# Patient Record
Sex: Female | Born: 1965 | Race: White | Hispanic: No | Smoking: Never smoker
Health system: Southern US, Community
[De-identification: ages and names within clinical notes are randomized; demographics above are authoritative.]

---

## 2016-03-08 ENCOUNTER — Emergency Department (HOSPITAL_COMMUNITY)
Admission: EM | Admit: 2016-03-08 | Discharge: 2016-03-08 | Disposition: A | Discharge status: Home / Self Care | Payer: Managed Care, Other (non HMO) | Attending: Emergency Medicine | Admitting: Emergency Medicine

## 2016-03-08 ENCOUNTER — Encounter (HOSPITAL_COMMUNITY): Payer: Self-pay | Admitting: *Deleted

## 2016-03-08 DIAGNOSIS — Y929 Unspecified place or not applicable: Secondary | ICD-10-CM | POA: Insufficient documentation

## 2016-03-08 DIAGNOSIS — Y9389 Activity, other specified: Secondary | ICD-10-CM | POA: Insufficient documentation

## 2016-03-08 DIAGNOSIS — S81811A Laceration without foreign body, right lower leg, initial encounter: Secondary | ICD-10-CM | POA: Insufficient documentation

## 2016-03-08 DIAGNOSIS — Y999 Unspecified external cause status: Secondary | ICD-10-CM | POA: Diagnosis not present

## 2016-03-08 DIAGNOSIS — W268XXA Contact with other sharp object(s), not elsewhere classified, initial encounter: Secondary | ICD-10-CM | POA: Diagnosis not present

## 2016-03-08 MED ORDER — LIDOCAINE-EPINEPHRINE 1 %-1:100000 IJ SOLN
INTRAMUSCULAR | Status: DC
Start: 2016-03-08 — End: 2016-03-08
  Filled 2016-03-08: qty 1

## 2016-03-08 MED ORDER — TETANUS-DIPHTH-ACELL PERTUSSIS 5-2.5-18.5 LF-MCG/0.5 IM SUSP
0.5000 mL | Freq: Once | INTRAMUSCULAR | Status: AC
  Administered 2016-03-08: 0.5 mL via INTRAMUSCULAR
  Filled 2016-03-08: qty 0.5

## 2016-03-08 MED ORDER — TRAMADOL HCL 50 MG PO TABS: 50.0000 mg | ORAL_TABLET | Freq: Four times a day (QID) | ORAL | Status: AC | PRN

## 2016-03-08 MED ORDER — LIDOCAINE-EPINEPHRINE 2 %-1:100000 IJ SOLN: 20.0000 mL | Freq: Once | INTRAMUSCULAR | Status: DC

## 2016-03-08 NOTE — Discharge Instructions (Signed)
Please follow up with your doctor or return to the ER in 7 days for sutures removal.  Follow instruction below.    Laceration Care, Adult A laceration is a cut that goes through all layers of the skin. The cut also goes into the tissue that is right under the skin. Some cuts heal on their own. Others need to be closed with stitches (sutures), staples, skin adhesive strips, or wound glue. Taking care of your cut lowers your risk of infection and helps your cut to heal better. HOW TO TAKE CARE OF YOUR CUT For stitches or staples:  Keep the wound clean and dry.  If you were given a bandage (dressing), you should change it at least one time per day or as told by your doctor. You should also change it if it gets wet or dirty.  Keep the wound completely dry for the first 24 hours or as told by your doctor. After that time, you may take a shower or a bath. However, make sure that the wound is not soaked in water until after the stitches or staples have been removed.  Clean the wound one time each day or as told by your doctor:  Wash the wound with soap and water.  Rinse the wound with water until all of the soap comes off.  Pat the wound dry with a clean towel. Do not rub the wound.  After you clean the wound, put a thin layer of antibiotic ointment on it as told by your doctor. This ointment:  Helps to prevent infection.  Keeps the bandage from sticking to the wound.  Have your stitches or staples removed as told by your doctor. If your doctor used skin adhesive strips:   Keep the wound clean and dry.  If you were given a bandage, you should change it at least one time per day or as told by your doctor. You should also change it if it gets dirty or wet.  Do not get the skin adhesive strips wet. You can take a shower or a bath, but be careful to keep the wound dry.  If the wound gets wet, pat it dry with a clean towel. Do not rub the wound.  Skin adhesive strips fall off on their own.  You can trim the strips as the wound heals. Do not remove any strips that are still stuck to the wound. They will fall off after a while. If your doctor used wound glue:  Try to keep your wound dry, but you may briefly wet it in the shower or bath. Do not soak the wound in water, such as by swimming.  After you take a shower or a bath, gently pat the wound dry with a clean towel. Do not rub the wound.  Do not do any activities that will make you really sweaty until the skin glue has fallen off on its own.  Do not apply liquid, cream, or ointment medicine to your wound while the skin glue is still on.  If you were given a bandage, you should change it at least one time per day or as told by your doctor. You should also change it if it gets dirty or wet.  If a bandage is placed over the wound, do not let the tape for the bandage touch the skin glue.  Do not pick at the glue. The skin glue usually stays on for 5-10 days. Then, it falls off of the skin. General Instructions  To  help prevent scarring, make sure to cover your wound with sunscreen whenever you are outside after stitches are removed, after adhesive strips are removed, or when wound glue stays in place and the wound is healed. Make sure to wear a sunscreen of at least 30 SPF.  Take over-the-counter and prescription medicines only as told by your doctor.  If you were given antibiotic medicine or ointment, take or apply it as told by your doctor. Do not stop using the antibiotic even if your wound is getting better.  Do not scratch or pick at the wound.  Keep all follow-up visits as told by your doctor. This is important.  Check your wound every day for signs of infection. Watch for:  Redness, swelling, or pain.  Fluid, blood, or pus.  Raise (elevate) the injured area above the level of your heart while you are sitting or lying down, if possible. GET HELP IF:  You got a tetanus shot and you have any of these problems at  the injection site:  Swelling.  Very bad pain.  Redness.  Bleeding.  You have a fever.  A wound that was closed breaks open.  You notice a bad smell coming from your wound or your bandage.  You notice something coming out of the wound, such as wood or glass.  Medicine does not help your pain.  You have more redness, swelling, or pain at the site of your wound.  You have fluid, blood, or pus coming from your wound.  You notice a change in the color of your skin near your wound.  You need to change the bandage often because fluid, blood, or pus is coming from the wound.  You start to have a new rash.  You start to have numbness around the wound. GET HELP RIGHT AWAY IF:  You have very bad swelling around the wound.  Your pain suddenly gets worse and is very bad.  You notice painful lumps near the wound or on skin that is anywhere on your body.  You have a red streak going away from your wound.  The wound is on your hand or foot and you cannot move a finger or toe like you usually can.  The wound is on your hand or foot and you notice that your fingers or toes look pale or bluish.   This information is not intended to replace advice given to you by your health care provider. Make sure you discuss any questions you have with your health care provider.   Document Released: 02/03/2008 Document Revised: 01/01/2015 Document Reviewed: 08/13/2014 Elsevier Interactive Patient Education [employer]

## 2016-03-08 NOTE — ED Notes (Signed)
Pt was testing a home-made water slide and hit a metal part of it and got a laceration to pt's right knee.  Pt laceration is still bleeding. Dressing changed in triage.  Pt unsure of Tetanus status.

## 2016-03-08 NOTE — ED Provider Notes (Signed)
CSN: [id number]     Arrival date & time 03/08/16  1027 History   First MD Initiated Contact with Patient 03/08/16 1216     Chief Complaint  Patient presents with  . Extremity Laceration     (Consider location/radiation/quality/duration/timing/severity/associated sxs/prior Treatment) HPI   50 year old female presents for evaluation of a laceration. Approximately 3 hours ago, patient was testing a homemade water slide when her right knee made contact with a sharp metal object from the water slide which lacerated her skin. She report that "blood was gushing" and she immediately around the wound and came to the ED. The pain is waxing waning, burning, mild to moderate in intensity, without any associated numbness. She have not tried to bend her knee inferior that it may make the problem worse. She is unable to recall her last tetanus status. She is not on any anticoagulants. Patient has no other complaint.  History reviewed. No pertinent past medical history. History reviewed. No pertinent past surgical history. No family history on file. Social History  Substance Use Topics  . Smoking status: Never Smoker   . Smokeless tobacco: None  . Alcohol Use: Yes     Comment: ocassional   OB History    No data available     Review of Systems  Constitutional: Negative for fever.  Skin: Positive for wound.  Neurological: Negative for numbness.      Allergies  Latex  Home Medications   Prior to Admission medications   Not on File   BP 164/95 mmHg  Pulse 63  Temp(Src) 97.7 F (36.5 C) (Oral)  Resp 16  Ht 5\' 3"  (1.6 m)  Wt 56.7 kg  BMI 22.15 kg/m2  SpO2 99%  LMP 02/23/2016 Physical Exam  Constitutional: She appears well-developed and well-nourished. No distress.  HENT:  Head: Atraumatic.  Eyes: Conjunctivae are normal.  Neck: Neck supple.  Cardiovascular: Intact distal pulses.   Musculoskeletal: She exhibits tenderness (Tenderness noted at the laceration site just below right  knee. Knee with normal flexion extension and no gross deformity.).  Neurological: She is alert.  Skin: No rash noted.  Right lower extremity: A 5 cm crescent shaped skin flap noted to the medial aspects of proximal tib-fib directly inferior to the patella region without any joint involvement. Not actively bleeding. No foreign object noted.  Psychiatric: She has a normal mood and affect.  Nursing note and vitals reviewed.   ED Course  Procedures (including critical care time)   MDM   Final diagnoses:  Leg laceration, right, initial encounter    BP 164/95 mmHg  Pulse 63  Temp(Src) 97.7 F (36.5 C) (Oral)  Resp 16  Ht 5\' 3"  (1.6 m)  Wt 56.7 kg  BMI 22.15 kg/m2  SpO2 99%  LMP 02/23/2016   12:28 PM Pt suffered a superficial laceration to her RLE approximately 3 hrs.  Wound amenable for suture repair.  Doubt joint involvement.   1:04 PM LACERATION REPAIR Performed by: Fayrene HelperRAN,Jahkai Yandell Authorized byFayrene Helper: Gareth Fitzner Consent: Verbal consent obtained. Risks and benefits: risks, benefits and alternatives were discussed Consent given by: patient Patient identity confirmed: provided demographic data Prepped and Draped in normal sterile fashion Wound explored  Laceration Location: R lower leg  Laceration Length: 5 cm  No Foreign Bodies seen or palpated  Anesthesia: local infiltration  Local anesthetic: lidocaine 2% w epinephrine  Anesthetic total: 4 ml  Irrigation method: syringe Amount of cleaning: standard  Skin closure: prolene 4.0  Number of sutures: 9  Technique: simple interrupted.  Patient tolerance: Patient tolerated the procedure well with no immediate complications.   Fayrene Helper, PA-C 03/08/16 1308  Gerhard Munch, MD 03/08/16 [phone]

## 2017-01-13 ENCOUNTER — Other Ambulatory Visit: Payer: Self-pay | Admitting: Internal Medicine

## 2017-01-13 ENCOUNTER — Encounter: Payer: Self-pay | Admitting: Gastroenterology

## 2017-01-13 DIAGNOSIS — Z1231 Encounter for screening mammogram for malignant neoplasm of breast: Secondary | ICD-10-CM

## 2017-01-29 ENCOUNTER — Ambulatory Visit: Payer: Managed Care, Other (non HMO)

## 2017-02-09 ENCOUNTER — Ambulatory Visit: Payer: Managed Care, Other (non HMO)

## 2017-03-04 ENCOUNTER — Ambulatory Visit
Admission: RE | Admit: 2017-03-04 | Discharge: 2017-03-04 | Disposition: A | Discharge status: Home / Self Care | Payer: 59 | Source: Ambulatory Visit | Attending: Internal Medicine | Admitting: Internal Medicine

## 2017-03-04 DIAGNOSIS — Z1231 Encounter for screening mammogram for malignant neoplasm of breast: Secondary | ICD-10-CM

## 2017-03-10 ENCOUNTER — Encounter: Payer: Managed Care, Other (non HMO) | Admitting: Gastroenterology

## 2018-10-14 ENCOUNTER — Other Ambulatory Visit: Payer: Self-pay | Admitting: Family Medicine

## 2018-10-14 DIAGNOSIS — Z1231 Encounter for screening mammogram for malignant neoplasm of breast: Secondary | ICD-10-CM

## 2018-10-19 ENCOUNTER — Ambulatory Visit
Admission: RE | Admit: 2018-10-19 | Discharge: 2018-10-19 | Disposition: A | Discharge status: Home / Self Care | Payer: 59 | Source: Ambulatory Visit | Attending: Family Medicine | Admitting: Family Medicine

## 2018-10-19 DIAGNOSIS — Z1231 Encounter for screening mammogram for malignant neoplasm of breast: Secondary | ICD-10-CM

## 2019-08-29 IMAGING — MG DIGITAL SCREENING BILATERAL MAMMOGRAM WITH CAD
4 series · 4 of 4 positions shown · non-contrast
Comparison: Previous exam(s).

CLINICAL DATA: Screening.

EXAM:
DIGITAL SCREENING BILATERAL MAMMOGRAM WITH CAD

[R MLO]
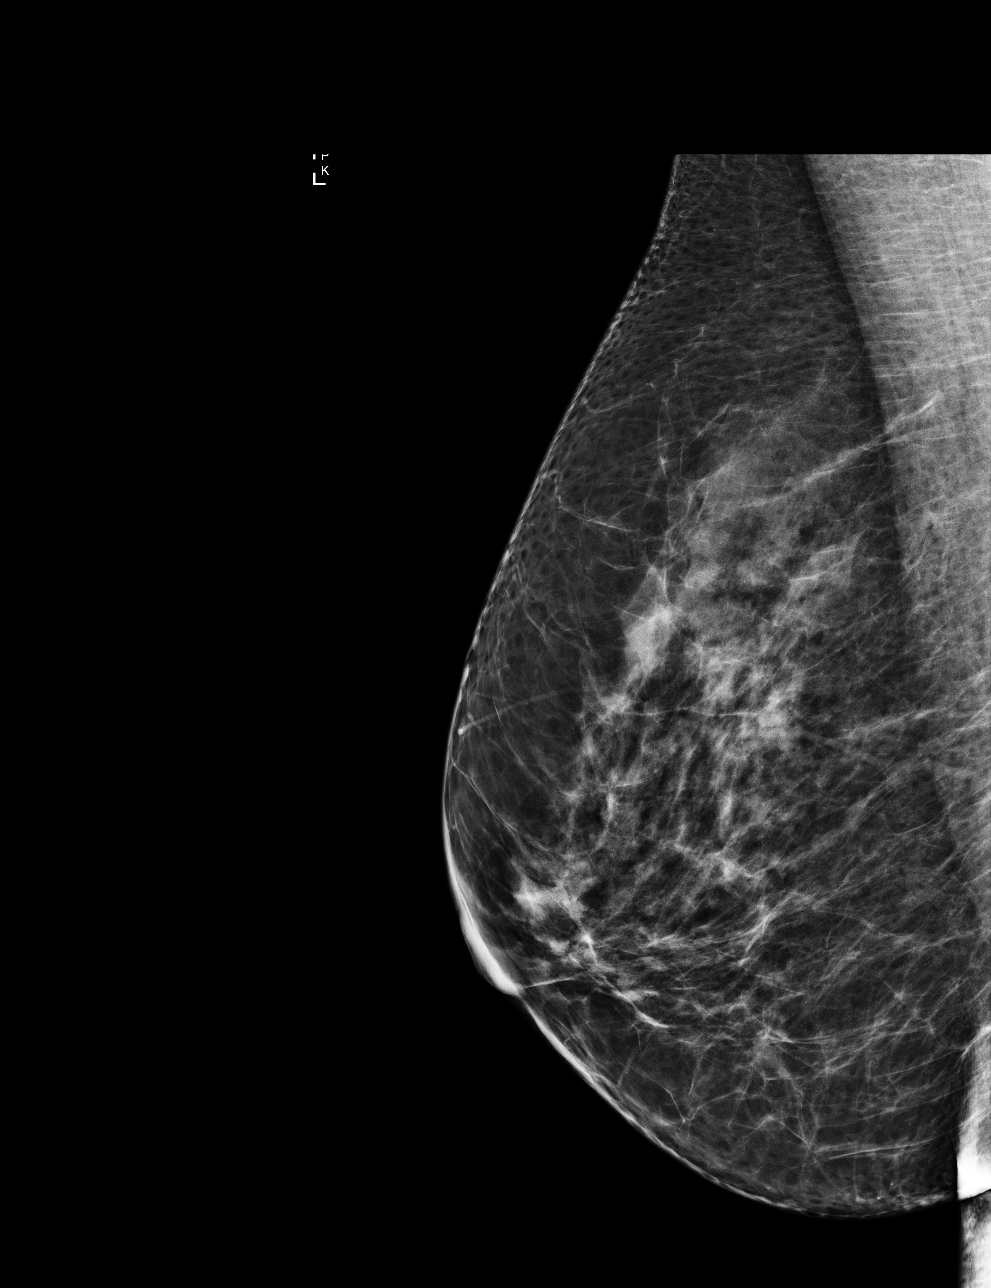

[L MLO]
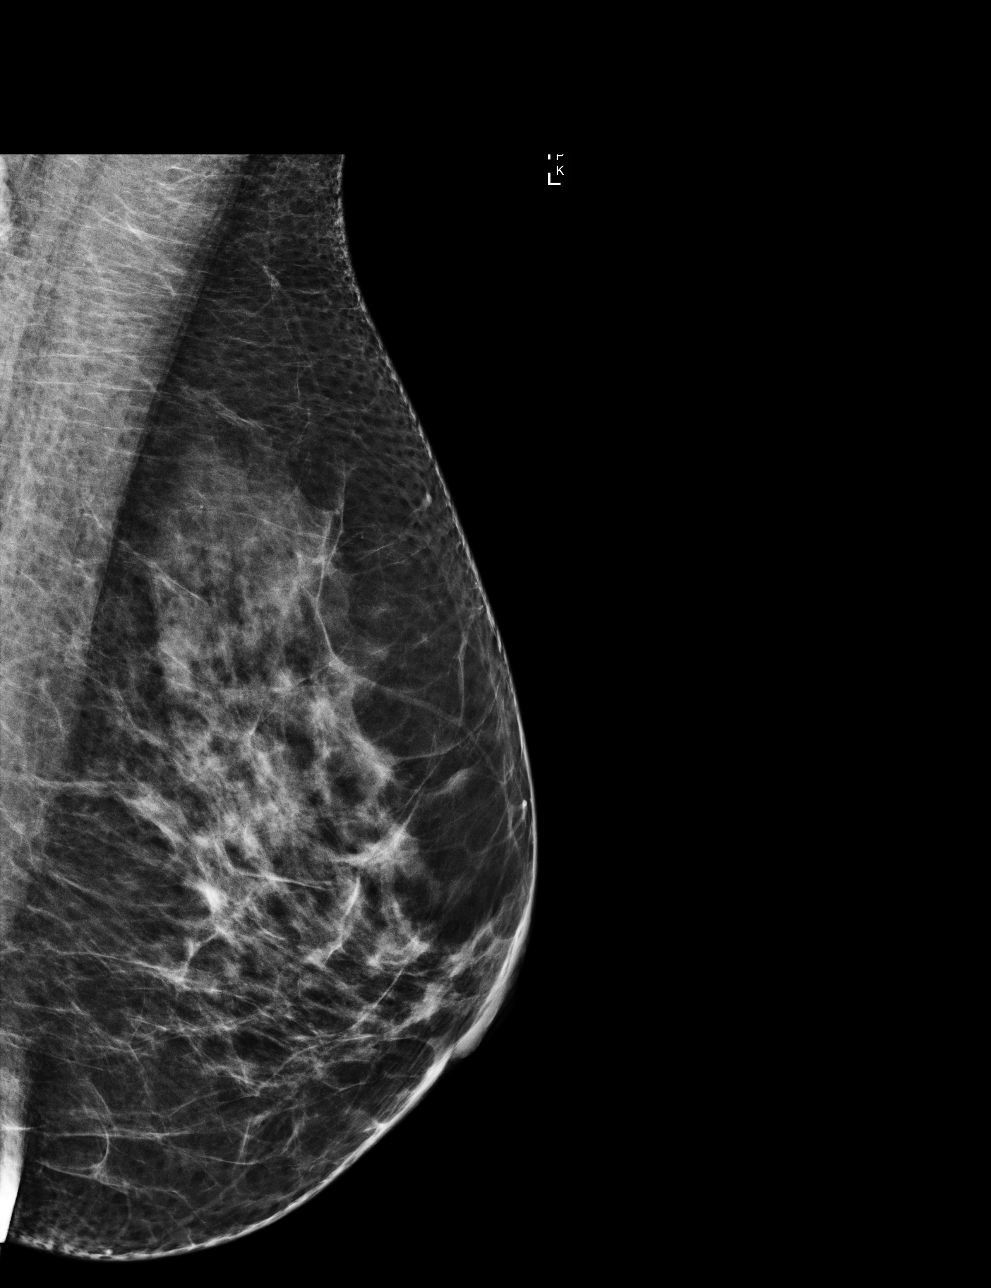

[L CC]
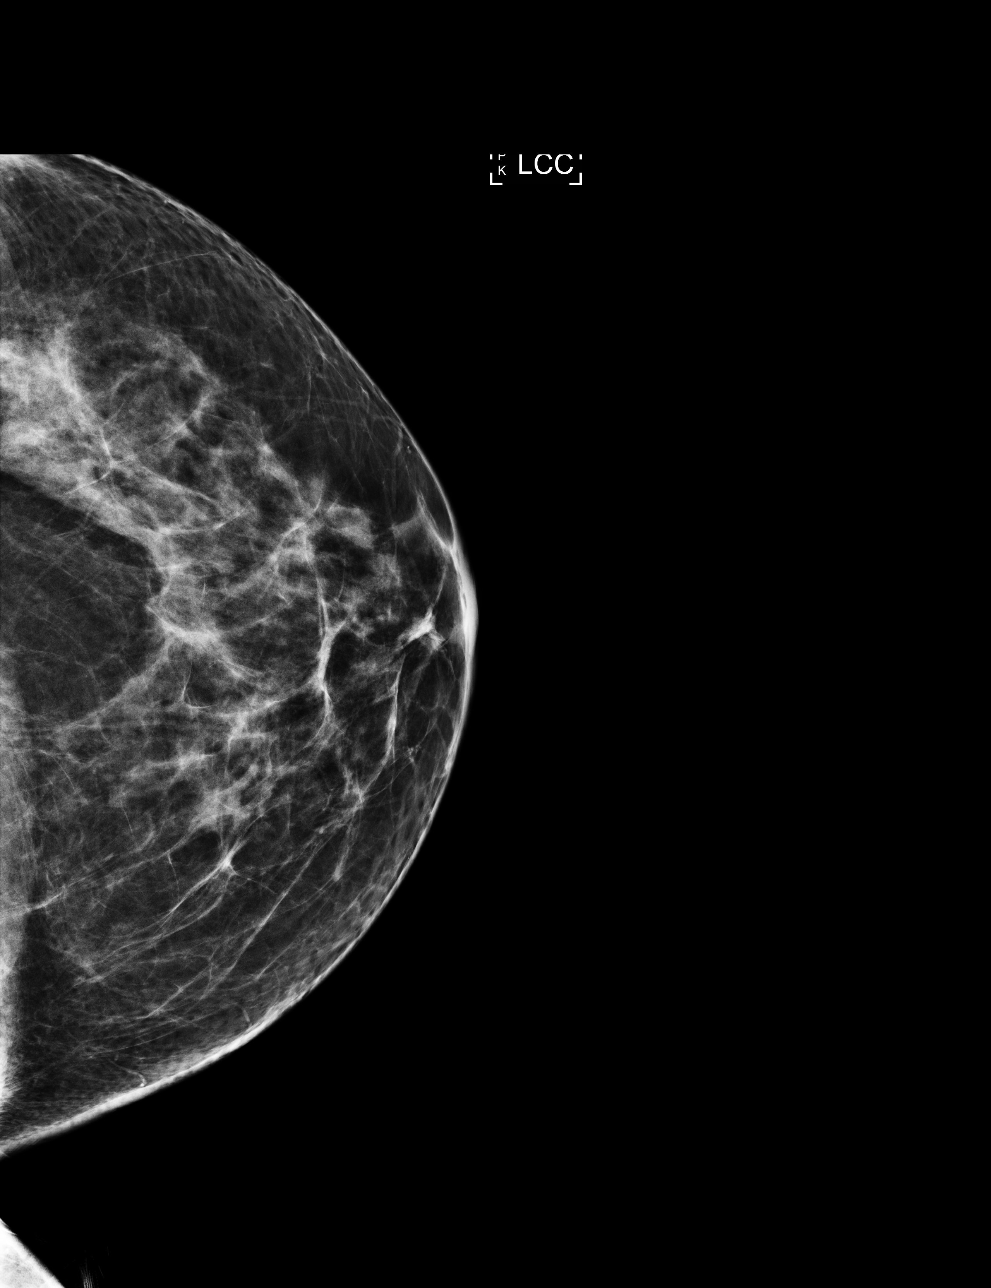

[R CC]
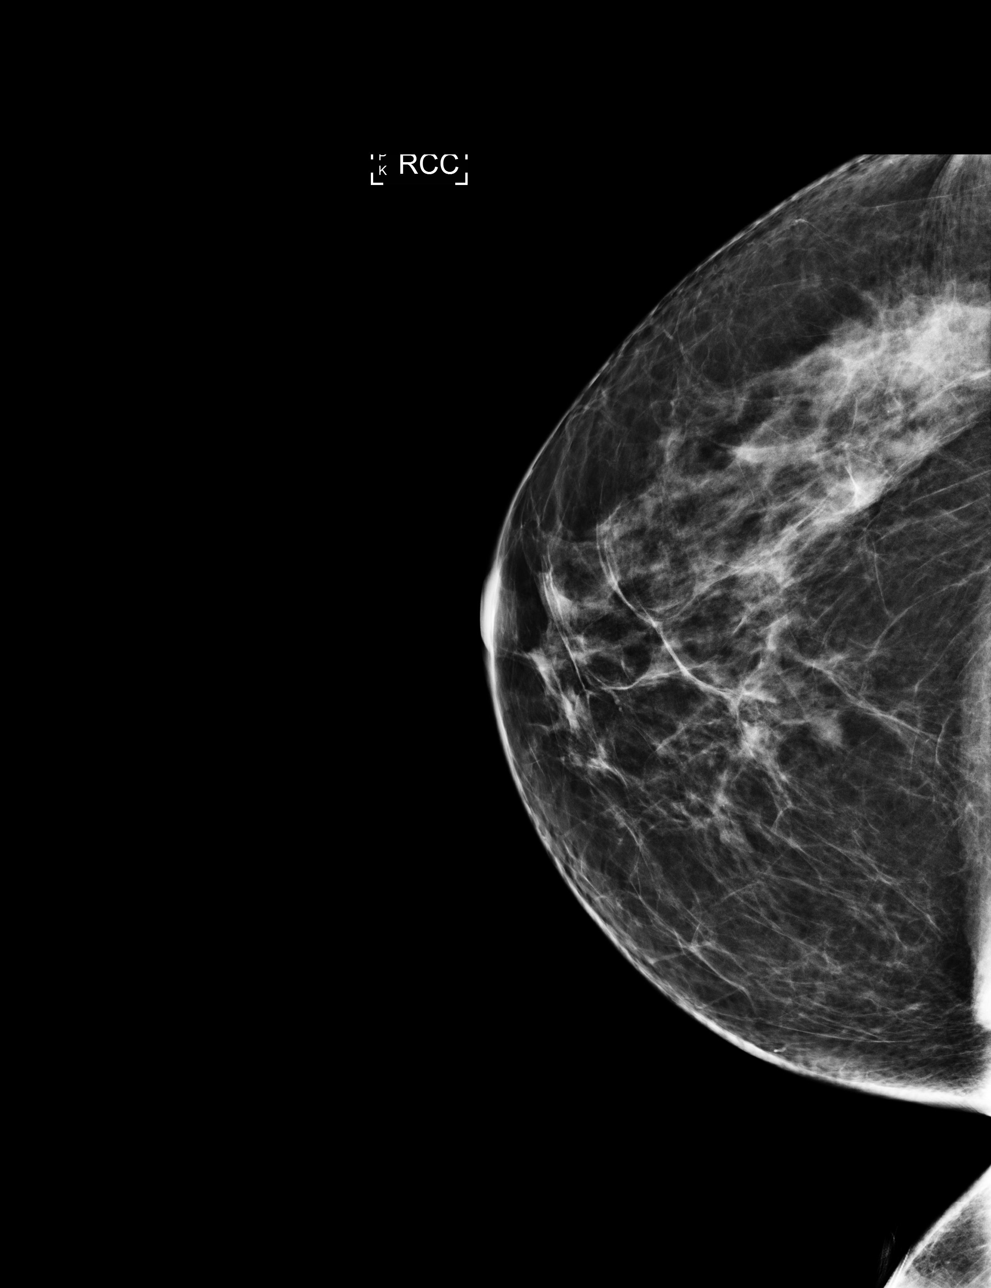

[4 of 4 positions shown; findings below may reference images not displayed]

ACR Breast Density Category c: The breast tissue is heterogeneously
dense, which may obscure small masses.
FINDINGS: There are no findings suspicious for malignancy. Images were
processed with CAD.
IMPRESSION: No mammographic evidence of malignancy. A result letter of this
screening mammogram will be mailed directly to the patient.

RECOMMENDATION:
Screening mammogram in one year. (Code:YJ-[id number])

BI-RADS CATEGORY  1: Negative.
